# Patient Record
Sex: Male | Born: 1990 | Race: White | Hispanic: No | Marital: Single | Smoking: Light tobacco smoker
Health system: Southern US, Community
[De-identification: ages and names within clinical notes are randomized; demographics above are authoritative.]

---

## 2012-11-27 ENCOUNTER — Emergency Department (HOSPITAL_COMMUNITY)
Admission: EM | Admit: 2012-11-27 | Discharge: 2012-11-27 | Disposition: A | Discharge status: Home / Self Care | Payer: BC Managed Care – PPO | Attending: Emergency Medicine | Admitting: Emergency Medicine

## 2012-11-27 ENCOUNTER — Emergency Department (HOSPITAL_COMMUNITY): Payer: BC Managed Care – PPO

## 2012-11-27 ENCOUNTER — Encounter (HOSPITAL_COMMUNITY): Payer: Self-pay | Admitting: Neurology

## 2012-11-27 DIAGNOSIS — W268XXA Contact with other sharp object(s), not elsewhere classified, initial encounter: Secondary | ICD-10-CM | POA: Insufficient documentation

## 2012-11-27 DIAGNOSIS — F17200 Nicotine dependence, unspecified, uncomplicated: Secondary | ICD-10-CM | POA: Insufficient documentation

## 2012-11-27 DIAGNOSIS — Y929 Unspecified place or not applicable: Secondary | ICD-10-CM | POA: Insufficient documentation

## 2012-11-27 DIAGNOSIS — S62309B Unspecified fracture of unspecified metacarpal bone, initial encounter for open fracture: Secondary | ICD-10-CM

## 2012-11-27 DIAGNOSIS — Y939 Activity, unspecified: Secondary | ICD-10-CM | POA: Insufficient documentation

## 2012-11-27 MED ORDER — TETANUS-DIPHTH-ACELL PERTUSSIS 5-2.5-18.5 LF-MCG/0.5 IM SUSP
0.5000 mL | Freq: Once | INTRAMUSCULAR | Status: AC
  Administered 2012-11-27: 0.5 mL via INTRAMUSCULAR
  Filled 2012-11-27: qty 0.5

## 2012-11-27 MED ORDER — CEFUROXIME AXETIL 250 MG PO TABS: 250.0000 mg | ORAL_TABLET | Freq: Two times a day (BID) | ORAL | Status: AC

## 2012-11-27 NOTE — ED Notes (Signed)
Ortho at bedside.

## 2012-11-27 NOTE — ED Provider Notes (Signed)
History     CSN: [id number]  Arrival date & time 11/27/12  1914   First MD Initiated Contact with Patient 11/27/12 [phone]      Chief Complaint  Patient presents with  . Hand Injury    (Consider location/radiation/quality/duration/timing/severity/associated sxs/prior treatment) HPI Comments: Patient is a 22 year old male who presents with left hand pain since last night. The pain started suddenly when the patient's hand became stuck between his truck and [employer]. The pain is throbbing and moderate without radiation. He reports associated edema, bruising and laceration of his left hand. He has been icing his hand for symptom relief. Movement of the left hand makes the pain worse. Nothing makes the pain better. No other injury.   Patient is a 22 y.o. male presenting with hand injury.  Hand Injury   History reviewed. No pertinent past medical history.  History reviewed. No pertinent past surgical history.  No family history on file.  History  Substance Use Topics  . Smoking status: Light Tobacco Smoker  . Smokeless tobacco: Not on file  . Alcohol Use: No      Review of Systems  Musculoskeletal: Positive for joint swelling and arthralgias.  Skin: Positive for wound.  All other systems reviewed and are negative.    Allergies  Review of patient's allergies indicates no known allergies.  Home Medications  No current outpatient prescriptions on file.  BP 149/77  Pulse 76  Temp(Src) 97.6 F (36.4 C)  SpO2 100%  Physical Exam  Nursing note and vitals reviewed. Constitutional: He is oriented to person, place, and time. He appears well-developed and well-nourished. No distress.  HENT:  Head: Normocephalic and atraumatic.  Eyes: Conjunctivae are normal.  Neck: Normal range of motion.  Cardiovascular: Normal rate and regular rhythm.  Exam reveals no gallop and no friction rub.   No murmur heard. Pulmonary/Chest: Effort normal and breath sounds normal. He has no  wheezes. He has no rales. He exhibits no tenderness.  Abdominal: Soft. There is no tenderness.  Musculoskeletal:  Left hand generalized edema. Bruising noted to volar aspect of left hand. Tenderness to palpation over distal second metacarpal. Limited ROM of all joints of left hand due to edema and pain.   Neurological: He is alert and oriented to person, place, and time. Coordination normal.  Speech is goal-oriented. Moves limbs without ataxia.   Skin: Skin is warm and dry.  V shaped laceration over volar aspect of left hand over distal second MCP.   Psychiatric: He has a normal mood and affect. His behavior is normal.    ED Course  Procedures (including critical care time)  Labs Reviewed - No data to display Dg Wrist Complete Left  11/27/2012  *RADIOLOGY REPORT*  Clinical Data: Injury, pain.  LEFT WRIST - COMPLETE 3+ VIEW  Comparison: None.  Findings: Imaged bones, joints and soft tissues appear normal.  IMPRESSION: Negative exam.   Original Report Authenticated By: Holley Dexter, M.D.    Dg Hand Complete Left  11/27/2012  *RADIOLOGY REPORT*  Clinical Data: Injury, pain.  LEFT HAND - COMPLETE 3+ VIEW  Comparison: None.  Findings: The patient has a fracture of the second metacarpal involving the distal diaphysis and extending into the metacarpal head.  The fracture is mildly comminuted but only minimally displaced.  No other acute bony or joint abnormality is identified. No radiopaque foreign body is identified.  Soft tissue swelling is noted.  IMPRESSION: Distal second metacarpal fracture as described.   Original Report Authenticated  By: Holley Dexter, M.D.      1. Metacarpal bone fracture, open, initial encounter       MDM  9:31 AM Xray shows fracture of second distal metacarpal. Laceration over volar aspect of hand overlying fracture that is now closed due to length of time between injury and current visit. I will consult Hand surgery.   10:47 AM Dr. Melvyn Novas agrees with plan  to have patient splinted and follow up in office. Patient will have Cefuroxime for prophylaxis. Patient will have tetanus shot. No neurovascular compromise. No further evaluation needed at this time.       Emilia Beck, PA-C 11/27/12 1049

## 2012-11-27 NOTE — ED Notes (Addendum)
Pt reporting left hand got hung on trailer tongue. Hand/fingers swollen, bruised. Laceration to palm V-shaped Bleeding controlled. Radial pulse present, sensation intact. A x 4

## 2012-11-27 NOTE — Progress Notes (Signed)
Orthopedic Tech Progress Note Patient Details:  Tyler Barnett [birth year] [id number] Applied volar splint LLE. Ortho Devices Type of Ortho Device: Short arm splint Ortho Device/Splint Interventions: Application   [name] 11/27/2012, 11:02 AM

## 2012-11-28 NOTE — ED Provider Notes (Signed)
Medical screening examination/treatment/procedure(s) were performed by non-physician practitioner and as supervising physician I was immediately available for consultation/collaboration.   Yadier L Joshiah Traynham, MD 11/28/12 1147 

## 2012-12-04 ENCOUNTER — Other Ambulatory Visit: Payer: Self-pay | Admitting: Orthopedic Surgery

## 2015-08-05 ENCOUNTER — Emergency Department: Payer: BLUE CROSS/BLUE SHIELD

## 2015-08-05 ENCOUNTER — Emergency Department
Admission: EM | Admit: 2015-08-05 | Discharge: 2015-08-05 | Disposition: A | Discharge status: Home / Self Care | Payer: BLUE CROSS/BLUE SHIELD | Attending: Emergency Medicine | Admitting: Emergency Medicine

## 2015-08-05 DIAGNOSIS — F419 Anxiety disorder, unspecified: Secondary | ICD-10-CM | POA: Diagnosis not present

## 2015-08-05 DIAGNOSIS — Z792 Long term (current) use of antibiotics: Secondary | ICD-10-CM | POA: Insufficient documentation

## 2015-08-05 DIAGNOSIS — R42 Dizziness and giddiness: Secondary | ICD-10-CM | POA: Diagnosis not present

## 2015-08-05 DIAGNOSIS — R079 Chest pain, unspecified: Secondary | ICD-10-CM | POA: Insufficient documentation

## 2015-08-05 DIAGNOSIS — R202 Paresthesia of skin: Secondary | ICD-10-CM | POA: Diagnosis not present

## 2015-08-05 DIAGNOSIS — F17200 Nicotine dependence, unspecified, uncomplicated: Secondary | ICD-10-CM | POA: Insufficient documentation

## 2015-08-05 DIAGNOSIS — Z79899 Other long term (current) drug therapy: Secondary | ICD-10-CM | POA: Insufficient documentation

## 2015-08-05 LAB — CBC
HCT: 43.8 % (ref 40.0–52.0)
HEMOGLOBIN: 15.3 g/dL (ref 13.0–18.0)
MCH: 32.3 pg (ref 26.0–34.0)
MCHC: 34.8 g/dL (ref 32.0–36.0)
MCV: 92.8 fL (ref 80.0–100.0)
Platelets: 186 10*3/uL (ref 150–440)
RBC: 4.72 MIL/uL (ref 4.40–5.90)
RDW: 13.1 % (ref 11.5–14.5)
WBC: 4.6 10*3/uL (ref 3.8–10.6)

## 2015-08-05 LAB — BASIC METABOLIC PANEL
ANION GAP: 7 (ref 5–15)
BUN: 13 mg/dL (ref 6–20)
CHLORIDE: 104 mmol/L (ref 101–111)
CO2: 28 mmol/L (ref 22–32)
Calcium: 9.6 mg/dL (ref 8.9–10.3)
Creatinine, Ser: 0.87 mg/dL (ref 0.61–1.24)
GFR calc non Af Amer: >60 mL/min (ref >60)
Glucose, Bld: 120 mg/dL — ABNORMAL HIGH (ref 65–99)
POTASSIUM: 4.3 mmol/L (ref 3.5–5.1)
Sodium: 139 mmol/L (ref 135–145)

## 2015-08-05 LAB — TROPONIN I: Troponin I: <0.03 ng/mL (ref <0.031)

## 2015-08-05 MED ORDER — LORAZEPAM 0.5 MG PO TABS: 0.5000 mg | ORAL_TABLET | Freq: Three times a day (TID) | ORAL | Status: AC | PRN

## 2015-08-05 NOTE — Discharge Instructions (Signed)
You have been seen in the emergency department today for chest pain. Your workup has shown normal results. As we discussed please follow-up with your primary care physician in the next 1-2 days for recheck. Return to the emergency department for any further chest pain, trouble breathing, or any other symptom personally concerning to yourself. ° °Nonspecific Chest Pain °It is often hard to find the cause of chest pain. There is always a chance that your pain could be related to something serious, such as a heart attack or a blood clot in your lungs. Chest pain can also be caused by conditions that are not life-threatening. If you have chest pain, it is very important to follow up with your doctor. ° °HOME CARE °· If you were prescribed an antibiotic medicine, finish it all even if you start to feel better. °· Avoid any activities that cause chest pain. °· Do not use any tobacco products, including cigarettes, chewing tobacco, or electronic cigarettes. If you need help quitting, ask your doctor. °· Do not drink alcohol. °· Take medicines only as told by your doctor. °· Keep all follow-up visits as told by your doctor. This is important. This includes any further testing if your chest pain does not go away. °· Your doctor may tell you to keep your head raised (elevated) while you sleep. °· Make lifestyle changes as told by your doctor. These may include: °¨ Getting regular exercise. Ask your doctor to suggest some activities that are safe for you. °¨ Eating a heart-healthy diet. Your doctor or a diet specialist (dietitian) can help you to learn healthy eating options. °¨ Maintaining a healthy weight. °¨ Managing diabetes, if necessary. °¨ Reducing stress. °GET HELP IF: °· Your chest pain does not go away, even after treatment. °· You have a rash with blisters on your chest. °· You have a fever. °GET HELP RIGHT AWAY IF: °· Your chest pain is worse. °· You have an increasing cough, or you cough up blood. °· You have  severe belly (abdominal) pain. °· You feel extremely weak. °· You pass out (faint). °· You have chills. °· You have sudden, unexplained chest discomfort. °· You have sudden, unexplained discomfort in your arms, back, neck, or jaw. °· You have shortness of breath at any time. °· You suddenly start to sweat, or your skin gets clammy. °· You feel nauseous. °· You vomit. °· You suddenly feel light-headed or dizzy. °· Your heart begins to beat quickly, or it feels like it is skipping beats. °These symptoms may be an emergency. Do not wait to see if the symptoms will go away. Get medical help right away. Call your local emergency services (911 in the [place] Do not drive yourself to the hospital. °  °This information is not intended to replace advice given to you by your health care provider. Make sure you discuss any questions you have with your health care provider. °  °Document Released: 01/10/2008 Document Revised: 08/14/2014 Document Reviewed: 02/27/2014 °Elsevier Interactive Patient Education ©2016 [employer] ° °

## 2015-08-05 NOTE — ED Notes (Signed)
Pt c/o substernal chest pain that started 30min PTA with dizziness..   Denies SOB/diaphoresis/N/V.Marland Kitchen. Pt is in NAD on arrival

## 2015-08-05 NOTE — ED Provider Notes (Signed)
[facility] Emergency Department Provider Note  Time seen: 1:23 PM  I have reviewed the triage vital signs and the nursing notes.   HISTORY  Chief Complaint Chest Pain    HPI Breeze Angell is a 24 y.o. male with no past medical history who presents the emergency department with chest pain. According to the patient approximately 30 minutes prior to arrival began feeling some chest discomfort with dizziness, tingling in his arms. Denies any nausea, shortness breath or diaphoresis. Patient states he was chopping wood when the pain started. He states however over the past 4-5 months he has been having similar symptoms at times, they usually occur during times of high stress. States he feels like they could be panic attacks these never dealt with anxiety in the past. States they only happen once every several weeks but when they do have been there fairly severe. He states sometimes he'll feel dizzy like he is going to pass out. Sometimes he has chest pain. Sometimes he just feels very antsy and anxious. Describes his chest pain today as brief lasting several minutes, no radiation but he did feel tingling in his arms. Denies any symptoms currently.     History reviewed. No pertinent past medical history.  There are no active problems to display for this patient.   History reviewed. No pertinent past surgical history.  Current Outpatient Rx  Name  Route  Sig  Dispense  Refill  . cefUROXime (CEFTIN) 250 MG tablet   Oral   Take 1 tablet (250 mg total) by mouth 2 (two) times daily.   20 tablet   0   . ibuprofen (ADVIL,MOTRIN) 200 MG tablet   Oral   Take 400 mg by mouth every 6 (six) hours as needed for pain.         . Multiple Vitamin (MULTIVITAMIN WITH MINERALS) TABS   Oral   Take 1 tablet by mouth daily.           Allergies Review of patient's allergies indicates no known allergies.  No family history on file.  Social History Social History   Substance Use Topics  . Smoking status: Light Tobacco Smoker  . Smokeless tobacco: None  . Alcohol Use: No    Review of Systems Constitutional: Negative for fever. Cardiovascular: Positive for chest pain, now resolved. Respiratory: Negative for shortness of breath. Gastrointestinal: Negative for abdominal pain, vomiting and diarrhea Neurological: Negative for headache 10-point ROS otherwise negative.  ____________________________________________   PHYSICAL EXAM:  VITAL SIGNS: ED Triage Vitals  Enc Vitals Group     BP 08/05/15 1142 143/76 mmHg     Pulse Rate 08/05/15 1142 91     Resp 08/05/15 1142 20     Temp 08/05/15 1142 98.8 F (37.1 C)     Temp Source 08/05/15 1142 Oral     SpO2 08/05/15 1142 98 %     Weight 08/05/15 1142 205 lb (92.987 kg)     Height 08/05/15 1142  (1.93 m)     Head Cir --      Peak Flow --      Pain Score 08/05/15 1146 5     Pain Loc --      Pain Edu? --      Excl. in GC? --     Constitutional: Alert and oriented. Well appearing and in no distress. Eyes: Normal exam ENT   Head: Normocephalic and atraumatic.   Mouth/Throat: Mucous membranes are moist. Cardiovascular: Normal rate, regular rhythm.  No murmurs, rubs, or gallops. Respiratory: Normal respiratory effort without tachypnea nor retractions. Breath sounds are clear Gastrointestinal: Soft and nontender. No distention.  Musculoskeletal: Nontender with normal range of motion in all extremities. No lower extremity tenderness or edema. Neurologic:  Normal speech and language. No gross focal neurologic deficits Skin:  Skin is warm, dry and intact.  Psychiatric: Mood and affect are normal. Speech and behavior are normal.  ____________________________________________    EKG  EKG reviewed and interpreted by myself shows normal sinus rhythm at 82 bpm, narrow QRS, normal axis, normal intervals, no ST changes. Normal EKG.  Chest x-ray shows no acute  abnormalities ____________________________________________     INITIAL IMPRESSION / ASSESSMENT AND PLAN / ED COURSE  Pertinent labs & imaging results that were available during my care of the patient were reviewed by me and considered in my medical decision making (see chart for details).  Patient presents to the emergency department with a brief episode of chest pain, feeling dizzy with tingling. States the symptoms have been ongoing for the past several months and labs are within normal limits. Troponin negative. EKG is normal. Chest x-ray is normal. Patient denies any symptoms in the emergency department. Patient symptoms are suggestive of anxiety. I discussed with the patient we could try a brief trial of Ativan, to be taken as needed. The patient states this only happens once every several weeks. Denies any drug or alcohol use. I discussed with the patient he cannot use drugs, alcohol, or pain medication while taking this medication, the patient states that is not an issue. Overall the patient appears very well. I still discussed with the patient the need to follow up with primary care physician regarding this discomfort, and the patient is agreeable.  ____________________________________________   FINAL CLINICAL IMPRESSION(S) / ED DIAGNOSES  Chest pain   Minna AntisKevin Dnya Hickle, MD 08/05/15 1327

## 2016-07-30 IMAGING — CR DG CHEST 2V
1 series · 2 of 2 positions shown · non-contrast
Comparison: None.

CLINICAL DATA: Chest pain

EXAM:
CHEST  2 VIEW

[Series 1: dg chest 2 view · 0.14mm/px · 2 of 2 slices shown]
[im 1/2]
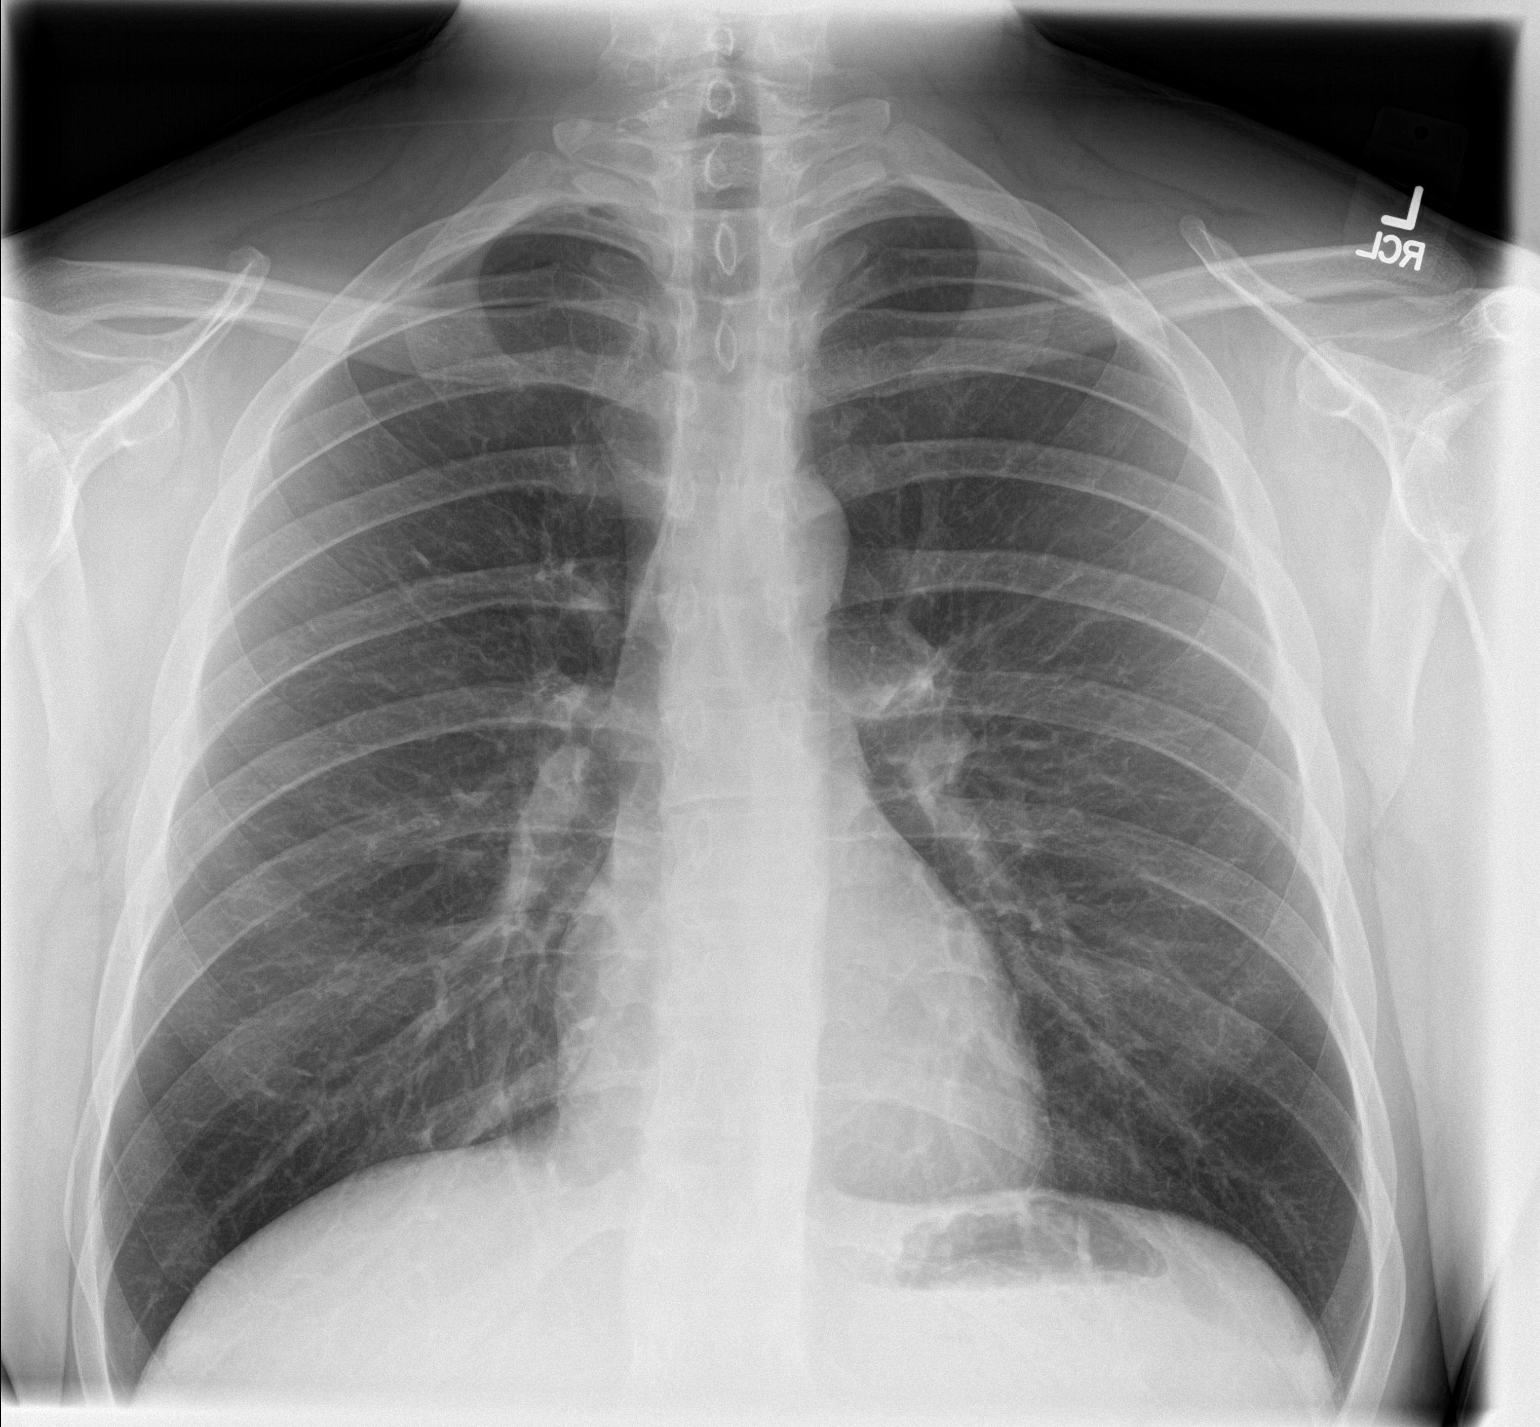
[im 2/2]
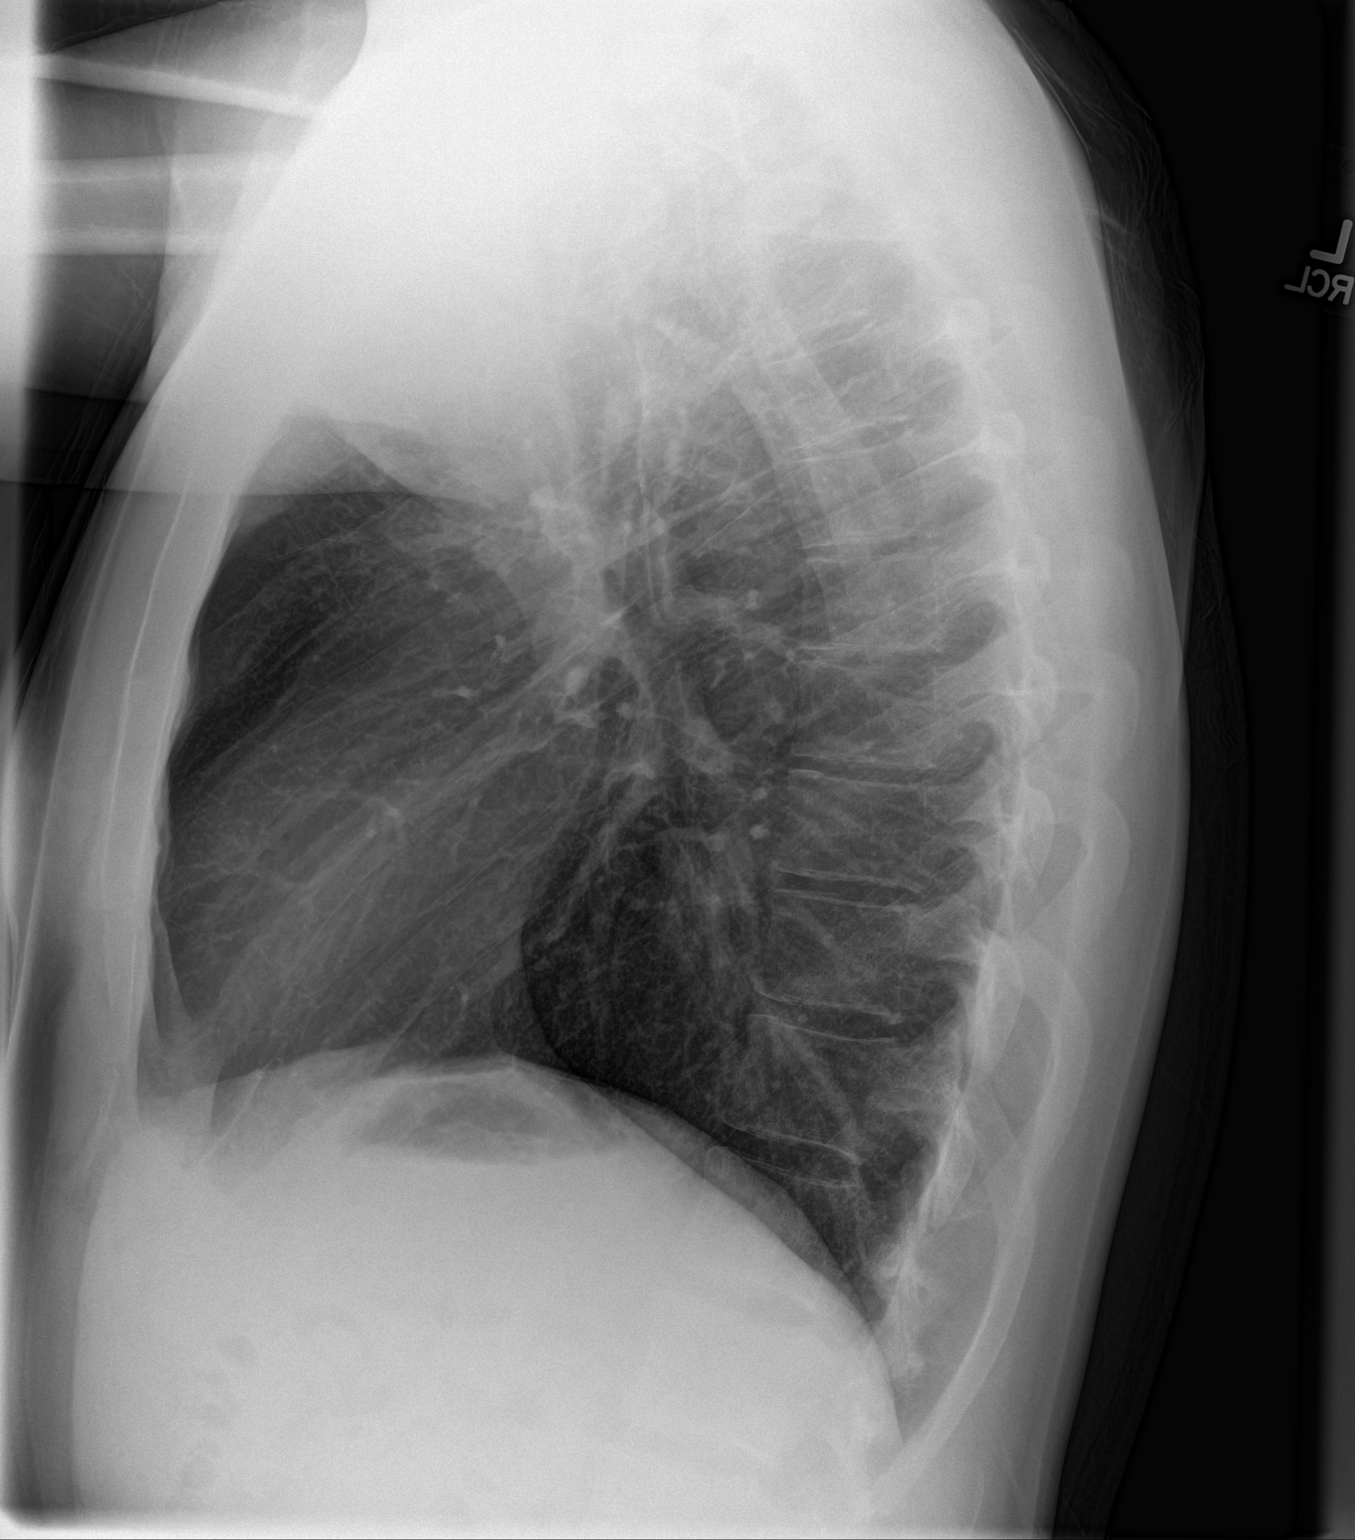

[2 of 2 positions shown; findings below may reference images not displayed]

FINDINGS: Normal heart size. Normal mediastinal contour. No pneumothorax. No
pleural effusion. Clear lungs, with no focal lung consolidation and
no pulmonary edema.
IMPRESSION: No active cardiopulmonary disease.
